# Patient Record
Sex: Female | Born: 2000 | Hispanic: No | Marital: Single
Health system: Southern US, Community
[De-identification: ages and names within clinical notes are randomized; demographics above are authoritative.]

## PROBLEM LIST (undated history)

## (undated) DIAGNOSIS — T7840XA Allergy, unspecified, initial encounter: Secondary | ICD-10-CM

## (undated) DIAGNOSIS — L309 Dermatitis, unspecified: Secondary | ICD-10-CM

## (undated) DIAGNOSIS — B019 Varicella without complication: Secondary | ICD-10-CM

---
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
## 2023-10-23 ENCOUNTER — Other Ambulatory Visit: Payer: Self-pay

## 2023-10-23 ENCOUNTER — Encounter (HOSPITAL_BASED_OUTPATIENT_CLINIC_OR_DEPARTMENT_OTHER): Payer: Self-pay

## 2023-10-23 ENCOUNTER — Emergency Department (HOSPITAL_BASED_OUTPATIENT_CLINIC_OR_DEPARTMENT_OTHER)
Admission: EM | Admit: 2023-10-23 | Discharge: 2023-10-23 | Disposition: A | Discharge status: Home / Self Care | Payer: No Typology Code available for payment source | Attending: Emergency Medicine | Admitting: Emergency Medicine

## 2023-10-23 DIAGNOSIS — R197 Diarrhea, unspecified: Secondary | ICD-10-CM | POA: Insufficient documentation

## 2023-10-23 DIAGNOSIS — R1110 Vomiting, unspecified: Secondary | ICD-10-CM | POA: Diagnosis present

## 2023-10-23 DIAGNOSIS — Z91010 Allergy to peanuts: Secondary | ICD-10-CM | POA: Diagnosis not present

## 2023-10-23 DIAGNOSIS — R112 Nausea with vomiting, unspecified: Secondary | ICD-10-CM

## 2023-10-23 MED ORDER — ONDANSETRON 8 MG PO TBDP: ORAL_TABLET | ORAL | 0 refills | Status: DC

## 2023-10-23 MED ORDER — ONDANSETRON 4 MG PO TBDP
8.0000 mg | ORAL_TABLET | Freq: Once | ORAL | Status: AC
  Administered 2023-10-23: 8 mg via ORAL
  Filled 2023-10-23: qty 2

## 2023-10-23 NOTE — ED Notes (Signed)
 PO Challenged pt with room temp ginger ale. Pt is in the lobby with family.

## 2023-10-23 NOTE — ED Provider Notes (Signed)
 [facility] EMERGENCY DEPARTMENT AT [facility] Provider Note   CSN: [id number] Arrival date & time: 10/23/23  0249     History  Chief Complaint  Patient presents with   Emesis    Catherine Hart is a 23 y.o. female.  The history is provided by the patient.  Emesis Severity:  Mild Timing:  Rare Quality:  Stomach contents Progression:  Unchanged Chronicity:  New Recent urination:  Normal Context: not post-tussive   Relieved by:  Nothing Worsened by:  Nothing Ineffective treatments:  None tried Associated symptoms: diarrhea   Associated symptoms: no abdominal pain and no fever   Risk factors: sick contacts   Sister is here as a patient for same and patient had emesis x 2 so checked in      Home Medications Prior to Admission medications   Medication Sig Start Date End Date Taking? Authorizing Provider  Cetirizine HCl (ZYRTEC PO) Take by mouth.    [provider]  diphenhydrAMINE  (BENADRYL ) 25 MG tablet Take 1 tablet (25 mg total) by mouth every 6 (six) hours. 05/11/17 05/16/17  Angelena Smalls, MD  ibuprofen  (ADVIL ,MOTRIN ) 400 MG tablet Take 1 tablet (400 mg total) by mouth every 6 (six) hours as needed. 03/19/18   Maczis, Michael M, PA-C  oseltamivir  (TAMIFLU ) 75 MG capsule Take 1 capsule (75 mg total) by mouth every 12 (twelve) hours. 08/08/19   Towana Ozell BROCKS, MD  ranitidine  (ZANTAC ) 150 MG tablet Take 1 tablet (150 mg total) by mouth 2 (two) times daily. 05/11/17 05/16/17  Angelena Smalls, MD      Allergies    Chocolate and Peanut-containing drug products    Review of Systems   Review of Systems  Constitutional:  Negative for fever.  HENT:  Negative for facial swelling.   Eyes:  Negative for redness.  Respiratory:  Negative for wheezing and stridor.   Gastrointestinal:  Positive for diarrhea and vomiting. Negative for abdominal pain.  All other systems reviewed and are negative.   Physical Exam Updated Vital Signs BP 112/79   Pulse 89    Temp 98.4 F (36.9 C)   Resp 16   Ht 5' 6 (1.676 m)   Wt 74.8 kg   SpO2 99%   BMI 26.63 kg/m  Physical Exam Vitals and nursing note reviewed.  Constitutional:      General: She is not in acute distress.    Appearance: She is well-developed.  HENT:     Head: Normocephalic and atraumatic.     Nose: Nose normal.  Eyes:     Pupils: Pupils are equal, round, and reactive to light.  Cardiovascular:     Rate and Rhythm: Normal rate and regular rhythm.     Pulses: Normal pulses.     Heart sounds: Normal heart sounds.  Pulmonary:     Effort: Pulmonary effort is normal. No respiratory distress.     Breath sounds: Normal breath sounds.  Abdominal:     General: Bowel sounds are normal. There is no distension.     Palpations: Abdomen is soft.     Tenderness: There is no abdominal tenderness. There is no guarding or rebound.  Musculoskeletal:        General: Normal range of motion.     Cervical back: Neck supple.  Skin:    General: Skin is warm and dry.     Capillary Refill: Capillary refill takes less than 2 seconds.     Findings: No erythema or rash.  Neurological:  General: No focal deficit present.     Deep Tendon Reflexes: Reflexes normal.  Psychiatric:        Mood and Affect: Mood normal.     ED Results / Procedures / Treatments   Labs (all labs ordered are listed, but only abnormal results are displayed) Labs Reviewed - No data to display  EKG None  Radiology No results found.  Procedures Procedures    Medications Ordered in ED Medications  ondansetron  (ZOFRAN -ODT) disintegrating tablet 8 mg (8 mg Oral Given 10/23/23 0309)    ED Course/ Medical Decision Making/ A&P                                 Medical Decision Making Here with sister who is also a patient   Amount and/or Complexity of Data Reviewed External Data Reviewed: notes.    Details: Previous notes reviewed   Risk Prescription drug management. Risk Details: No signs of dehydration.  Well appearing, normal vital signs.  PO challenged successfully.  Likely the norovirus.  Counseled patient on hygiene at home to prevent others from becoming infected.  Stable for discharge.  Strict return    Final Clinical Impression(s) / ED Diagnoses Final diagnoses:  None   Return for intractable cough, coughing up blood, fevers > 100.4 unrelieved by medication, shortness of breath, intractable vomiting, chest pain, shortness of breath, weakness, numbness, changes in speech, facial asymmetry, abdominal pain, passing out, Inability to tolerate liquids or food, cough, altered mental status or any concerns. No signs of systemic illness or infection. The patient is nontoxic-appearing on exam and vital signs are within normal limits.  I have reviewed the triage vital signs and the nursing notes. Pertinent labs & imaging results that were available during my care of the patient were reviewed by me and considered in my medical decision making (see chart for details). After history, exam, and medical workup I feel the patient has been appropriately medically screened and is safe for discharge home. Pertinent diagnoses were discussed with the patient. Patient was given return precautions.  Rx / DC Orders ED Discharge Orders     None         Jenevie Casstevens, MD 10/23/23 [phone]

## 2023-10-23 NOTE — ED Triage Notes (Signed)
 Pt vomited x2 today. Sister has the same thing. No fevers. No other sx.

## 2024-04-28 ENCOUNTER — Emergency Department (HOSPITAL_BASED_OUTPATIENT_CLINIC_OR_DEPARTMENT_OTHER)

## 2024-04-28 ENCOUNTER — Emergency Department (HOSPITAL_BASED_OUTPATIENT_CLINIC_OR_DEPARTMENT_OTHER)
Admission: EM | Admit: 2024-04-28 | Discharge: 2024-04-28 | Disposition: A | Discharge status: Home / Self Care | Attending: Emergency Medicine | Admitting: Emergency Medicine

## 2024-04-28 ENCOUNTER — Other Ambulatory Visit: Payer: Self-pay

## 2024-04-28 DIAGNOSIS — Z91010 Allergy to peanuts: Secondary | ICD-10-CM | POA: Diagnosis not present

## 2024-04-28 DIAGNOSIS — R000 Tachycardia, unspecified: Secondary | ICD-10-CM | POA: Diagnosis not present

## 2024-04-28 DIAGNOSIS — N83201 Unspecified ovarian cyst, right side: Secondary | ICD-10-CM | POA: Diagnosis not present

## 2024-04-28 DIAGNOSIS — R1031 Right lower quadrant pain: Secondary | ICD-10-CM

## 2024-04-28 DIAGNOSIS — N83209 Unspecified ovarian cyst, unspecified side: Secondary | ICD-10-CM

## 2024-04-28 LAB — CBC
HCT: 44.9 % (ref 36.0–46.0)
Hemoglobin: 15.2 g/dL — ABNORMAL HIGH (ref 12.0–15.0)
MCH: 28.4 pg (ref 26.0–34.0)
MCHC: 33.9 g/dL (ref 30.0–36.0)
MCV: 83.8 fL (ref 80.0–100.0)
Platelets: 265 K/uL (ref 150–400)
RBC: 5.36 MIL/uL — ABNORMAL HIGH (ref 3.87–5.11)
RDW: 12.6 % (ref 11.5–15.5)
WBC: 7 K/uL (ref 4.0–10.5)
nRBC: 0 % (ref 0.0–0.2)

## 2024-04-28 LAB — URINALYSIS, ROUTINE W REFLEX MICROSCOPIC
Bilirubin Urine: NEGATIVE
Glucose, UA: NEGATIVE mg/dL
Hgb urine dipstick: NEGATIVE
Ketones, ur: NEGATIVE mg/dL
Nitrite: NEGATIVE
Protein, ur: NEGATIVE mg/dL
Specific Gravity, Urine: <=1.005 (ref 1.005–1.030)
pH: 5.5 (ref 5.0–8.0)

## 2024-04-28 LAB — COMPREHENSIVE METABOLIC PANEL WITH GFR
ALT: 9 U/L (ref 0–44)
AST: 13 U/L — ABNORMAL LOW (ref 15–41)
Albumin: 4.5 g/dL (ref 3.5–5.0)
Alkaline Phosphatase: 54 U/L (ref 38–126)
Anion gap: 12 (ref 5–15)
BUN: 13 mg/dL (ref 6–20)
CO2: 26 mmol/L (ref 22–32)
Calcium: 9.5 mg/dL (ref 8.9–10.3)
Chloride: 102 mmol/L (ref 98–111)
Creatinine, Ser: 0.84 mg/dL (ref 0.44–1.00)
GFR, Estimated: >60 mL/min (ref >60)
Glucose, Bld: 91 mg/dL (ref 70–99)
Potassium: 3.6 mmol/L (ref 3.5–5.1)
Sodium: 140 mmol/L (ref 135–145)
Total Bilirubin: 1.9 mg/dL — ABNORMAL HIGH (ref 0.0–1.2)
Total Protein: 7.3 g/dL (ref 6.5–8.1)

## 2024-04-28 LAB — PREGNANCY, URINE: Preg Test, Ur: NEGATIVE

## 2024-04-28 LAB — URINALYSIS, MICROSCOPIC (REFLEX)

## 2024-04-28 LAB — LIPASE, BLOOD: Lipase: 37 U/L (ref 11–51)

## 2024-04-28 MED ORDER — IOHEXOL 300 MG/ML  SOLN
100.0000 mL | Freq: Once | INTRAMUSCULAR | Status: AC | PRN
  Administered 2024-04-28: 100 mL via INTRAVENOUS

## 2024-04-28 MED ORDER — IBUPROFEN 600 MG PO TABS: 600.0000 mg | ORAL_TABLET | Freq: Four times a day (QID) | ORAL | 0 refills | Status: AC | PRN

## 2024-04-28 MED ORDER — KETOROLAC TROMETHAMINE 15 MG/ML IJ SOLN
15.0000 mg | Freq: Once | INTRAMUSCULAR | Status: AC
  Administered 2024-04-28: 15 mg via INTRAVENOUS
  Filled 2024-04-28: qty 1

## 2024-04-28 MED ORDER — ONDANSETRON 4 MG PO TBDP: 4.0000 mg | ORAL_TABLET | Freq: Three times a day (TID) | ORAL | 0 refills | Status: AC | PRN

## 2024-04-28 NOTE — ED Triage Notes (Addendum)
 Pt reports lower abdominal pain that began this am whem she woke. Worse in right side. Pain radiates to bottom. Pain comes in waves. Denies N/V/D NO vaginal discharge. Last BM 2 days. Urine has been darker

## 2024-04-28 NOTE — Discharge Instructions (Signed)
 Please read and follow all provided instructions.  Your diagnoses today include:  1. Ruptured ovarian cyst   2. RLQ abdominal pain     Tests performed today include: Complete blood cell count: Complete metabolic panel: Lipase (pancreas function test): Urinalysis (urine test): Pregnancy test (urine or blood, in women only): Negative CT scan of your abdomen and pelvis showed a normal appendix, fluid and a ruptured ovarian cyst in the right lower quadrant likely the cause of your symptoms Vital signs. See below for your results today.   Medications prescribed:  Naproxen - anti-inflammatory pain medication Do not exceed 500mg  naproxen every 12 hours, take with food  You have been prescribed an anti-inflammatory medication or NSAID. Take with food. Take smallest effective dose for the shortest duration needed for your pain. Stop taking if you experience stomach pain or vomiting.   Zofran  (ondansetron ) - for nausea and vomiting  Take any prescribed medications only as directed.  Home care instructions:  Follow any educational materials contained in this packet.  Follow-up instructions: Please follow-up with your OB/GYN as needed for further evaluation of your symptoms.    Return instructions:  SEEK IMMEDIATE MEDICAL ATTENTION IF: The pain does not go away or becomes severe  A temperature above 101F develops  Repeated vomiting occurs (multiple episodes)  The pain becomes localized to portions of the abdomen. The right side could possibly be appendicitis. In an adult, the left lower portion of the abdomen could be colitis or diverticulitis.  Blood is being passed in stools or vomit (bright red or black tarry stools)  You develop chest pain, difficulty breathing, dizziness or fainting, or become confused, poorly responsive, or inconsolable (young children) If you have any other emergent concerns regarding your health  Additional Information: Abdominal (belly) pain can be caused by  many things. Your caregiver performed an examination and possibly ordered blood/urine tests and imaging (CT scan, x-rays, ultrasound). Many cases can be observed and treated at home after initial evaluation in the emergency department. Even though you are being discharged home, abdominal pain can be unpredictable. Therefore, you need a repeated exam if your pain does not resolve, returns, or worsens. Most patients with abdominal pain don't have to be admitted to the hospital or have surgery, but serious problems like appendicitis and gallbladder attacks can start out as nonspecific pain. Many abdominal conditions cannot be diagnosed in one visit, so follow-up evaluations are very important.  Your vital signs today were: BP 109/68   Pulse 73   Temp 98 F (36.7 C) (Oral)   Resp 18   Wt 72.6 kg   LMP 04/16/2024 (Approximate)   SpO2 99%   BMI 25.82 kg/m  If your blood pressure (bp) was elevated above 135/85 this visit, please have this repeated by your doctor within one month. --------------

## 2024-04-28 NOTE — ED Provider Notes (Signed)
 [facility] Provider Note   CSN: [id number] Arrival date & time: 04/28/24  9083     Patient presents with: Abdominal Pain   Catherine Hart is a 23 y.o. female.   Patient with no past surgical history presents to the emergency department today for evaluation of right lower abdominal pressure.  Patient was in her normal state of health yesterday.  She woke with the symptoms today.  She did have some nausea but no vomiting.  Pain is worse with movement.  No urinary symptoms.  No dysuria, hematuria, increased frequency or urgency.  No chest pain or shortness of breath.  Pain is worse with movement.  She denies history of ovarian cysts.  No history of kidney stones.  No treatments prior to arrival.       Prior to Admission medications   Medication Sig Start Date End Date Taking? Authorizing Provider  Cetirizine HCl (ZYRTEC PO) Take by mouth.    [provider]  diphenhydrAMINE  (BENADRYL ) 25 MG tablet Take 1 tablet (25 mg total) by mouth every 6 (six) hours. 05/11/17 05/16/17  Angelena Smalls, MD  ibuprofen  (ADVIL ,MOTRIN ) 400 MG tablet Take 1 tablet (400 mg total) by mouth every 6 (six) hours as needed. 03/19/18   Maczis, Michael M, PA-C  ondansetron  (ZOFRAN -ODT) 8 MG disintegrating tablet 8mg  ODT q8 hours prn nausea 10/23/23   Palumbo, April, MD  oseltamivir  (TAMIFLU ) 75 MG capsule Take 1 capsule (75 mg total) by mouth every 12 (twelve) hours. 08/08/19   Towana Ozell BROCKS, MD  ranitidine  (ZANTAC ) 150 MG tablet Take 1 tablet (150 mg total) by mouth 2 (two) times daily. 05/11/17 05/16/17  Angelena Smalls, MD    Allergies: Chocolate and Peanut-containing drug products    Review of Systems  Updated Vital Signs BP (!) 134/94   Pulse (!) 120   Temp 98 F (36.7 C) (Oral)   Resp 16   Wt 72.6 kg   LMP 04/16/2024 (Approximate)   SpO2 100%   BMI 25.82 kg/m   Physical Exam Vitals and nursing note reviewed.  Constitutional:      General:  She is not in acute distress.    Appearance: She is well-developed.  HENT:     Head: Normocephalic and atraumatic.     Right Ear: External ear normal.     Left Ear: External ear normal.     Nose: Nose normal.  Eyes:     Conjunctiva/sclera: Conjunctivae normal.  Cardiovascular:     Rate and Rhythm: Regular rhythm. Tachycardia present.     Heart sounds: No murmur heard. Pulmonary:     Effort: No respiratory distress.     Breath sounds: No wheezing, rhonchi or rales.  Abdominal:     Palpations: Abdomen is soft.     Tenderness: There is abdominal tenderness in the right lower quadrant, periumbilical area and suprapubic area. There is no guarding or rebound. Negative signs include Murphy's sign and McBurney's sign.  Musculoskeletal:     Cervical back: Normal range of motion and neck supple.     Right lower leg: No edema.     Left lower leg: No edema.  Skin:    General: Skin is warm and dry.     Findings: No rash.  Neurological:     General: No focal deficit present.     Mental Status: She is alert. Mental status is at baseline.     Motor: No weakness.  Psychiatric:  Mood and Affect: Mood normal.     (all labs ordered are listed, but only abnormal results are displayed) Labs Reviewed  COMPREHENSIVE METABOLIC PANEL WITH GFR - Abnormal; Notable for the following components:      Result Value   AST 13 (*)    Total Bilirubin 1.9 (*)    All other components within normal limits  CBC - Abnormal; Notable for the following components:   RBC 5.36 (*)    Hemoglobin 15.2 (*)    All other components within normal limits  URINALYSIS, ROUTINE W REFLEX MICROSCOPIC - Abnormal; Notable for the following components:   Leukocytes,Ua LARGE (*)    All other components within normal limits  URINALYSIS, MICROSCOPIC (REFLEX) - Abnormal; Notable for the following components:   Bacteria, UA MANY (*)    All other components within normal limits  LIPASE, BLOOD  PREGNANCY, URINE     EKG: None  Radiology: CT ABDOMEN PELVIS W CONTRAST Result Date: 04/28/2024 CLINICAL DATA:  Right lower quadrant abdominal pain since awakening this morning. No nausea, vomiting or diarrhea. No leukocytosis. EXAM: CT ABDOMEN AND PELVIS WITH CONTRAST TECHNIQUE: Multidetector CT imaging of the abdomen and pelvis was performed using the standard protocol following bolus administration of intravenous contrast. RADIATION DOSE REDUCTION: This exam was performed according to the departmental dose-optimization program which includes automated exposure control, adjustment of the mA and/or kV according to patient size and/or use of iterative reconstruction technique. CONTRAST:  100mL OMNIPAQUE  IOHEXOL  300 MG/ML  SOLN COMPARISON:  None Available. FINDINGS: Lower chest: Subpleural 4 mm nodule at the left lung base on image 5/5, likely a subpleural lymph node. No specific follow-up imaging recommended. The lung bases are otherwise clear. Hepatobiliary: The liver is normal in density without suspicious focal abnormality. No evidence of gallstones, gallbladder wall thickening or biliary dilatation. Pancreas: Unremarkable. No pancreatic ductal dilatation or surrounding inflammatory changes. Spleen: Normal in size without focal abnormality. Adrenals/Urinary Tract: Both adrenal glands appear normal. No evidence of urinary tract calculus, suspicious renal lesion or hydronephrosis. The bladder appears unremarkable for its degree of distention. Stomach/Bowel: No enteric contrast administered. The stomach appears unremarkable for its degree of distension. No evidence of bowel wall thickening, distention or surrounding inflammatory change. The appendix is visualized anterior to the lumbosacral junction, best seen on the coronal and sagittal images, and appears normal. The proximal colon is decompressed. Mildly prominent stool in the distal colon. Vascular/Lymphatic: There are no enlarged abdominal or pelvic lymph nodes. Mildly  prominent inguinal and right ileocolonic mesenteric lymph nodes, likely reactive. No significant vascular findings. Reproductive: Suspected ruptured right ovarian cyst or collapsing follicle measuring approximately 3.1 x 1.7 x 1.4 cm. Small to moderate amount of surrounding low-density free pelvic fluid. The uterus and left ovary appear unremarkable. Other: As above, free pelvic fluid. No generalized ascites or pneumoperitoneum. The abdominal wall appears intact. Musculoskeletal: No acute or significant osseous findings. IMPRESSION: 1. Suspected ruptured right ovarian cyst or collapsing follicle with small to moderate amount of surrounding low-density free pelvic fluid. Consider pelvic ultrasound for further evaluation. 2. No evidence of appendicitis or other acute abdominopelvic findings. Electronically Signed   By: Elsie Perone M.D.   On: 04/28/2024 11:20     Procedures   Medications Ordered in the ED - No data to display  ED Course  Patient seen and examined. History obtained directly from patient.   Labs/EKG: Ordered CBC, CMP, lipase, UA, pregnancy  Imaging: She will need ultrasound or CT imaging, awaiting labs  Medications/Fluids:  Offered pain and nausea medication, patient declines  Most recent vital signs reviewed and are as follows: BP (!) 134/94   Pulse (!) 120   Temp 98 F (36.7 C) (Oral)   Resp 16   Wt 72.6 kg   LMP 04/16/2024 (Approximate)   SpO2 100%   BMI 25.82 kg/m   Initial impression: Right-sided abdominal pain  10:49 AM Reassessment performed. Patient appears stable.  Abdominal exam unchanged.  Worst pain seems to be the right lower abdomen, lateral to midline, below the level of the bellybutton, but not deep into the pelvis.  Labwork personally reviewed and interpreted including: CBC with normal white blood cell count 7.0, hemoglobin slightly high at 15.2; CMP unremarkable; lipase normal; pregnancy negative; UA is not a clean-catch, there are 6-10 white blood  cells and many bacteria.  Imaging personally visualized and interpreted including: Will start with CT imaging, discussed possibility of needing to follow-up with ultrasound with any abnormal results in the pelvis.  Most current vital signs reviewed and are as follows: BP (!) 134/94   Pulse (!) 106   Temp 98 F (36.7 C) (Oral)   Resp 16   Wt 72.6 kg   LMP 04/16/2024 (Approximate)   SpO2 100%   BMI 25.82 kg/m   Plan: Reeval after CT.  12:07 PM Reassessment performed. Patient appears stable, comfortable.  Will go ahead and give a dose of IV Toradol  prior to discharge.  Imaging personally visualized and interpreted including: CT imaging, I agree normal appendix, I agree fluid noted in the right lower quadrant, report reports suspected ruptured right ovarian cyst with small to moderate amount of surrounding fluid.  Reviewed pertinent lab work and imaging with patient at bedside. Questions answered.   Ruptured ovarian cyst is consistent with the patient's symptoms today, in addition to lab work.  White blood cell count is normal, no fever.  Low concern for tubo-ovarian abscess.  No severe pain or vomiting to suggest ovarian torsion.  Most current vital signs reviewed and are as follows: BP 109/68   Pulse 73   Temp 98 F (36.7 C) (Oral)   Resp 18   Wt 72.6 kg   LMP 04/16/2024 (Approximate)   SpO2 99%   BMI 25.82 kg/m   Plan: Discharge to home.   Prescriptions written for: Zofran , naproxen  Other home care instructions discussed: Rest, OTC meds  ED return instructions discussed:The patient was urged to return to the Emergency Department immediately with worsening of current symptoms, worsening abdominal pain, persistent vomiting, blood noted in stools, fever, or any other concerns. The patient verbalized understanding.   Follow-up instructions discussed: Patient encouraged to follow-up with their OB/GYN as needed.                                     Medical Decision  Making Amount and/or Complexity of Data Reviewed Labs: ordered. Radiology: ordered.  Risk Prescription drug management.   For this patient's complaint of abdominal pain, the following conditions were considered on the differential diagnosis: gastritis/PUD, enteritis/duodenitis, appendicitis, cholelithiasis/cholecystitis, cholangitis, pancreatitis, ruptured viscus, colitis, diverticulitis, small/large bowel obstruction, proctitis, cystitis, pyelonephritis, ureteral colic, aortic dissection, aortic aneurysm. In women, ectopic pregnancy, pelvic inflammatory disease, ovarian cysts, and tubo-ovarian abscess were also considered. Atypical chest etiologies were also considered including ACS, PE, and pneumonia.  CT imaging is consistent with ruptured ovarian cyst.  Lab workup is reassuring.  No fevers.  Patient is  not pregnant.  The patient's vital signs, pertinent lab work and imaging were reviewed and interpreted as discussed in the ED course. Hospitalization was considered for further testing, treatments, or serial exams/observation. However as patient is well-appearing, has a stable exam, and reassuring studies today, I do not feel that they warrant admission at this time. This plan was discussed with the patient who verbalizes agreement and comfort with this plan and seems reliable and able to return to the Emergency Department with worsening or changing symptoms.       Final diagnoses:  Ruptured ovarian cyst  RLQ abdominal pain    ED Discharge Orders          Ordered    ibuprofen  (ADVIL ) 600 MG tablet  Every 6 hours PRN        04/28/24 1154    ondansetron  (ZOFRAN -ODT) 4 MG disintegrating tablet  Every 8 hours PRN        04/28/24 1154               Deangela Randleman, PA-C 04/28/24 1209    Floyd, Dan, DO 04/29/24 [id number]
